# Patient Record
Sex: Female | Born: 1935
Health system: Southern US, Community
[De-identification: ages and names within clinical notes are randomized; demographics above are authoritative.]

## PROBLEM LIST (undated history)

## (undated) DIAGNOSIS — F0280 Dementia in other diseases classified elsewhere without behavioral disturbance: Secondary | ICD-10-CM

## (undated) DIAGNOSIS — M810 Age-related osteoporosis without current pathological fracture: Secondary | ICD-10-CM

## (undated) DIAGNOSIS — C189 Malignant neoplasm of colon, unspecified: Secondary | ICD-10-CM

## (undated) DIAGNOSIS — F329 Major depressive disorder, single episode, unspecified: Secondary | ICD-10-CM

## (undated) DIAGNOSIS — G309 Alzheimer's disease, unspecified: Secondary | ICD-10-CM

## (undated) DIAGNOSIS — K219 Gastro-esophageal reflux disease without esophagitis: Secondary | ICD-10-CM

## (undated) DIAGNOSIS — F32A Depression, unspecified: Secondary | ICD-10-CM

## (undated) DIAGNOSIS — F419 Anxiety disorder, unspecified: Secondary | ICD-10-CM

## (undated) HISTORY — PX: TOTAL HIP ARTHROPLASTY: SHX124

---

## 2008-06-13 ENCOUNTER — Ambulatory Visit
Admission: RE | Admit: 2008-06-13 | Disposition: A | Discharge status: Home / Self Care | Payer: Self-pay | Source: Ambulatory Visit

## 2014-01-09 ENCOUNTER — Inpatient Hospital Stay: Payer: Self-pay | Admitting: Internal Medicine

## 2014-01-09 LAB — TROPONIN I: Troponin-I: <0.02 ng/mL

## 2014-01-09 LAB — COMPREHENSIVE METABOLIC PANEL
ALBUMIN: 3.1 g/dL — AB (ref 3.4–5.0)
ALT: 30 U/L (ref 12–78)
ANION GAP: 6 — AB (ref 7–16)
AST: 25 U/L (ref 15–37)
Alkaline Phosphatase: 126 U/L — ABNORMAL HIGH
BUN: 19 mg/dL — ABNORMAL HIGH (ref 7–18)
Bilirubin,Total: 0.7 mg/dL (ref 0.2–1.0)
Calcium, Total: 8.8 mg/dL (ref 8.5–10.1)
Chloride: 108 mmol/L — ABNORMAL HIGH (ref 98–107)
Co2: 26 mmol/L (ref 21–32)
Creatinine: 0.96 mg/dL (ref 0.60–1.30)
GFR CALC AF AMER: 60 — AB
GFR CALC NON AF AMER: 51 — AB
GLUCOSE: 96 mg/dL (ref 65–99)
Osmolality: 282 (ref 275–301)
Potassium: 4 mmol/L (ref 3.5–5.1)
Sodium: 140 mmol/L (ref 136–145)
Total Protein: 6.7 g/dL (ref 6.4–8.2)

## 2014-01-09 LAB — CBC
HCT: 42.4 % (ref 35.0–47.0)
HGB: 13.9 g/dL (ref 12.0–16.0)
MCH: 31.1 pg (ref 26.0–34.0)
MCHC: 32.8 g/dL (ref 32.0–36.0)
MCV: 95 fL (ref 80–100)
Platelet: 252 10*3/uL (ref 150–440)
RBC: 4.47 10*6/uL (ref 3.80–5.20)
RDW: 14.2 % (ref 11.5–14.5)
WBC: 7.7 10*3/uL (ref 3.6–11.0)

## 2014-01-09 LAB — URINALYSIS, COMPLETE
Bacteria: NONE SEEN
Bilirubin,UR: NEGATIVE
GLUCOSE, UR: NEGATIVE mg/dL (ref 0–75)
NITRITE: NEGATIVE
Ph: 6 (ref 4.5–8.0)
Protein: NEGATIVE
Specific Gravity: 1.015 (ref 1.003–1.030)
Squamous Epithelial: <1
WBC UR: 3 /HPF (ref 0–5)

## 2014-01-10 LAB — BASIC METABOLIC PANEL
ANION GAP: 4 — AB (ref 7–16)
BUN: 21 mg/dL — AB (ref 7–18)
CREATININE: 0.91 mg/dL (ref 0.60–1.30)
Calcium, Total: 8.7 mg/dL (ref 8.5–10.1)
Chloride: 110 mmol/L — ABNORMAL HIGH (ref 98–107)
Co2: 27 mmol/L (ref 21–32)
EGFR (African American): >60
GFR CALC NON AF AMER: 55 — AB
Glucose: 90 mg/dL (ref 65–99)
Osmolality: 284 (ref 275–301)
POTASSIUM: 4.3 mmol/L (ref 3.5–5.1)
Sodium: 141 mmol/L (ref 136–145)

## 2014-01-10 LAB — CBC WITH DIFFERENTIAL/PLATELET
Basophil #: 0.1 10*3/uL (ref 0.0–0.1)
Basophil %: 0.7 %
Eosinophil #: 0.3 10*3/uL (ref 0.0–0.7)
Eosinophil %: 3.6 %
HCT: 40.8 % (ref 35.0–47.0)
HGB: 13.5 g/dL (ref 12.0–16.0)
Lymphocyte #: 1.5 10*3/uL (ref 1.0–3.6)
Lymphocyte %: 17.8 %
MCH: 31.1 pg (ref 26.0–34.0)
MCHC: 33 g/dL (ref 32.0–36.0)
MCV: 95 fL (ref 80–100)
MONO ABS: 0.8 x10 3/mm (ref 0.2–0.9)
Monocyte %: 9.9 %
Neutrophil #: 5.7 10*3/uL (ref 1.4–6.5)
Neutrophil %: 68 %
Platelet: 237 10*3/uL (ref 150–440)
RBC: 4.32 10*6/uL (ref 3.80–5.20)
RDW: 14 % (ref 11.5–14.5)
WBC: 8.4 10*3/uL (ref 3.6–11.0)

## 2014-01-11 LAB — PLATELET COUNT: PLATELETS: 211 10*3/uL (ref 150–440)

## 2014-01-11 LAB — BASIC METABOLIC PANEL
Anion Gap: 6 — ABNORMAL LOW (ref 7–16)
BUN: 20 mg/dL — AB (ref 7–18)
Calcium, Total: 8.2 mg/dL — ABNORMAL LOW (ref 8.5–10.1)
Chloride: 110 mmol/L — ABNORMAL HIGH (ref 98–107)
Co2: 22 mmol/L (ref 21–32)
Creatinine: 0.88 mg/dL (ref 0.60–1.30)
EGFR (African American): >60
GFR CALC NON AF AMER: 57 — AB
GLUCOSE: 113 mg/dL — AB (ref 65–99)
Osmolality: 279 (ref 275–301)
POTASSIUM: 4.2 mmol/L (ref 3.5–5.1)
SODIUM: 138 mmol/L (ref 136–145)

## 2014-01-11 LAB — HEMOGLOBIN: HGB: 10.4 g/dL — AB (ref 12.0–16.0)

## 2014-01-12 LAB — BASIC METABOLIC PANEL
Anion Gap: 9 (ref 7–16)
BUN: 16 mg/dL (ref 7–18)
CALCIUM: 8 mg/dL — AB (ref 8.5–10.1)
CO2: 23 mmol/L (ref 21–32)
Chloride: 110 mmol/L — ABNORMAL HIGH (ref 98–107)
Creatinine: 1.08 mg/dL (ref 0.60–1.30)
EGFR (Non-African Amer.): 45 — ABNORMAL LOW
GFR CALC AF AMER: 52 — AB
Glucose: 96 mg/dL (ref 65–99)
Osmolality: 284 (ref 275–301)
Potassium: 3.6 mmol/L (ref 3.5–5.1)
Sodium: 142 mmol/L (ref 136–145)

## 2014-01-12 LAB — HEMOGLOBIN: HGB: 9 g/dL — ABNORMAL LOW (ref 12.0–16.0)

## 2014-01-13 LAB — CBC WITH DIFFERENTIAL/PLATELET
Basophil #: 0 10*3/uL (ref 0.0–0.1)
Basophil %: 0.3 %
Eosinophil #: 0.1 10*3/uL (ref 0.0–0.7)
Eosinophil %: 0.9 %
HCT: 23.6 % — AB (ref 35.0–47.0)
HGB: 7.7 g/dL — ABNORMAL LOW (ref 12.0–16.0)
LYMPHS ABS: 0.8 10*3/uL — AB (ref 1.0–3.6)
LYMPHS PCT: 9 %
MCH: 30.6 pg (ref 26.0–34.0)
MCHC: 32.4 g/dL (ref 32.0–36.0)
MCV: 95 fL (ref 80–100)
MONOS PCT: 10 %
Monocyte #: 0.8 x10 3/mm (ref 0.2–0.9)
Neutrophil #: 6.7 10*3/uL — ABNORMAL HIGH (ref 1.4–6.5)
Neutrophil %: 79.8 %
Platelet: 168 10*3/uL (ref 150–440)
RBC: 2.5 10*6/uL — ABNORMAL LOW (ref 3.80–5.20)
RDW: 13.9 % (ref 11.5–14.5)
WBC: 8.4 10*3/uL (ref 3.6–11.0)

## 2014-01-14 LAB — URINALYSIS, COMPLETE
BACTERIA: NONE SEEN
Bilirubin,UR: NEGATIVE
Blood: NEGATIVE
Glucose,UR: NEGATIVE mg/dL (ref 0–75)
Ketone: NEGATIVE
Leukocyte Esterase: NEGATIVE
Nitrite: NEGATIVE
Ph: 7 (ref 4.5–8.0)
Protein: NEGATIVE
RBC,UR: <1 /HPF (ref 0–5)
Specific Gravity: 1.012 (ref 1.003–1.030)
Squamous Epithelial: NONE SEEN

## 2014-01-14 LAB — PATHOLOGY REPORT

## 2014-01-14 LAB — HEMOGLOBIN: HGB: 8.8 g/dL — ABNORMAL LOW (ref 12.0–16.0)

## 2014-01-15 LAB — URINE CULTURE

## 2014-02-01 ENCOUNTER — Emergency Department: Payer: Self-pay | Admitting: Emergency Medicine

## 2014-02-01 LAB — COMPREHENSIVE METABOLIC PANEL
ALBUMIN: 2.8 g/dL — AB (ref 3.4–5.0)
ALK PHOS: 223 U/L — AB
AST: 48 U/L — AB (ref 15–37)
Anion Gap: 4 — ABNORMAL LOW (ref 7–16)
BUN: 20 mg/dL — ABNORMAL HIGH (ref 7–18)
Bilirubin,Total: 0.4 mg/dL (ref 0.2–1.0)
CREATININE: 0.85 mg/dL (ref 0.60–1.30)
Calcium, Total: 9.3 mg/dL (ref 8.5–10.1)
Chloride: 108 mmol/L — ABNORMAL HIGH (ref 98–107)
Co2: 32 mmol/L (ref 21–32)
EGFR (African American): >60
GFR CALC NON AF AMER: 59 — AB
Glucose: 126 mg/dL — ABNORMAL HIGH (ref 65–99)
OSMOLALITY: 291 (ref 275–301)
Potassium: 5 mmol/L (ref 3.5–5.1)
SGPT (ALT): 18 U/L (ref 12–78)
Sodium: 144 mmol/L (ref 136–145)
TOTAL PROTEIN: 6.9 g/dL (ref 6.4–8.2)

## 2014-02-01 LAB — CBC
HCT: 37.8 % (ref 35.0–47.0)
HGB: 12.3 g/dL (ref 12.0–16.0)
MCH: 30.8 pg (ref 26.0–34.0)
MCHC: 32.6 g/dL (ref 32.0–36.0)
MCV: 95 fL (ref 80–100)
Platelet: 273 10*3/uL (ref 150–440)
RBC: 3.99 10*6/uL (ref 3.80–5.20)
RDW: 15.3 % — ABNORMAL HIGH (ref 11.5–14.5)
WBC: 7.5 10*3/uL (ref 3.6–11.0)

## 2014-02-01 LAB — LIPASE, BLOOD: Lipase: 108 U/L (ref 73–393)

## 2014-02-01 LAB — TROPONIN I: Troponin-I: <0.02 ng/mL

## 2014-02-02 LAB — URINALYSIS, COMPLETE
Bilirubin,UR: NEGATIVE
Glucose,UR: NEGATIVE mg/dL (ref 0–75)
Ketone: NEGATIVE
Nitrite: POSITIVE
Ph: 6 (ref 4.5–8.0)
Protein: NEGATIVE
RBC,UR: 33 /HPF (ref 0–5)
SPECIFIC GRAVITY: 1.056 (ref 1.003–1.030)
Squamous Epithelial: 2

## 2015-01-18 NOTE — Consult Note (Signed)
Brief Consult Note: Diagnosis: right femoral neck fracture and osteoarthritis.   Patient was seen by consultant.   Consult note dictated.   Recommend to proceed with surgery or procedure.   Orders entered.   Comments: plan right THA tomorrrow.  Electronic Signatures: Laurene Footman (MD)  (Signed 15-Apr-15 18:50)  Authored: Brief Consult Note   Last Updated: 15-Apr-15 18:50 by Laurene Footman (MD)

## 2015-01-18 NOTE — Consult Note (Signed)
PATIENT NAME:  Leslie Snyder, Leslie Snyder MR#:  [id number] DATE OF BIRTH:  1918-10-28  DATE OF CONSULTATION:  01/09/2014  CONSULTING PHYSICIAN:  Laurene Footman, MD  REASON FOR CONSULTATION: Right hip fracture.   HISTORY OF PRESENT ILLNESS: The patient is a 81, 81 year old who suffered a fall a few weeks ago. She initially had a nondisplaced fracture. She came in today with increasing pain and inability to stand, and has x-rays that show a femoral neck fracture where the neck is eroding to the subchondral bone of the head and is unable to bear weight.  Marland Kitchen   PAST MEDICAL HISTORY: Remarkable for significant confusion. She has Alzheimer's that is a significant chronic problem. She has been ambulatory with a walker. Goes to an adult Alzheimer daycare program.   PHYSICAL EXAMINATION: Her right leg has pain with any rotation. It is shortened, but without external rotation. She flexes and extends the toes and has good pulses. No edema. Skin is intact about the hip   X-rays were reviewed. Discussed treatment options with the patient's daughter. Recommendation is for anterior approach hip replacement. The risks, benefits and possible complications were discussed and that this should allow her to be mobile and bear full weight immediately. Risk, in particular, infection, deep vein thrombosis and femoral fracture including with future falls, getting a periprosthetic fracture were all discussed.  She understands this plan and we will plan on surgery tomorrow afternoon.      ____________________________ Laurene Footman, MD mjm:dmm D: 01/09/2014 18:50:41 ET T: 01/09/2014 19:25:40 ET JOB#: [id number]  cc: Laurene Footman, MD, <Dictator> Laurene Footman MD ELECTRONICALLY SIGNED 01/10/2014 7:16

## 2015-01-18 NOTE — Discharge Summary (Signed)
PATIENT NAME:  Leslie Snyder, Leslie Snyder MR#:  [id number] DATE OF BIRTH:  [birth year]  DATE OF ADMISSION:  01/09/2014 DATE OF DISCHARGE:  01/14/2014  PRESENTING COMPLAINT: Fall and right hip pain.   DISCHARGE DIAGNOSES: 1.  Displaced femoral neck fracture status post right total hip replacement by Dr. Rudene Christians, which was done on April 16th.  2.  Dementia.  3.  Dysphagia.   CONDITION ON DISCHARGE: Fair.   CODE STATUS: No code, DNR.   DISCHARGE MEDICATIONS: 1.  Bupropion 75 mg b.i.d.  2.  Omeprazole 40 mg daily.  3.  Alprazolam 0.25 mg p.o. t.i.d. p.r.n.  4.  Escitalopram 5 mg daily.  5.  Docusate 100 mg daily as needed.  6.  Multivitamin with minerals p.o. daily.  7.  Viactiv soft calcium chews calcium with vitamin D p.o. daily.  8.  Namenda XR 21 mg at bedtime.  9.  Acetaminophen 325 mg 2 tablets every 4 hours as needed.  10.  Magnesium hydroxide 30 mL b.i.d. as needed.  11.  Aluminium hydroxide with magnesium hydroxide and simethicone 30 mL every 6 hours as needed. 12.  Enoxaparin 40 mg subcu once a day for 14 days.  13.  Nystatin topical powder 1 application 3 times a day.  14.  Tramadol 50 mg at bedtime.  15.  Tramadol 50 mg every 6 hours as needed.   DIET:  Regular, diet consistency Pureed, nectar thick liquids.   SPEECH THERAPY RECOMMENDATIONS.  1.  Please send Magic Cup with all meal trays. Aspiration precautions. Meds in puree and feeding. assistance.  2.  Dysphagia-I pureed nectar thick. No straws. Please send Mighty shakes t.i.d. on meals.  3.  Speech therapy, physical therapy and dietitian to follow.   DISCHARGE FOLLOWUP: Follow up with [facility] ortho in 2 weeks.  CONSULTANTS: Orthopedics, Dr. Rudene Christians.   DIAGNOSTIC DATA: UA negative for UTI. Hemoglobin is 8.8. White count is 8.4. Basic metabolic panel is within normal limits.   BRIEF SUMMARY OF HOSPITAL COURSE: Leslie Snyder is a 81 year old Caucasian female with history of dementia who comes in after she had a mechanical fall  at home. She was admitted with:  1.  Displaced right femur fracture status post right total hip arthroplasty. It was done by Dr. Rudene Christians on 16th of April. The patient tolerated the procedure well. She is currently on tramadol and Tylenol as needed. Lovenox was given for DVT prophylaxis. The patient is going to be discharged to [facility] for rehab.  2.  Fever postop, resolved. Fever work-up was negative.  3.  Dysphagia prior to admission. Speech evaluation done. She is currently tolerating pureed nectar thick liquid diet.  4.  Dementia. She is getting her Namenda and bupropion, which has been resumed along with p.r.n. Xanax. The patient is already on Lexapro, which has been resumed.   The patient received 1 unit of blood transfusion after surgery since her hemoglobin dropped down to 7.7. Her hemoglobin at discharge is 8.8.   The patient overall had stable hospital course. She will follow up with orthopedics as outpatient. Hospital stay otherwise remained stable. The patient remained a no code, DNR.   TIME SPENT: 40 minutes.   ____________________________ Hart Rochester Posey Pronto, MD sap:sb D: 01/14/2014 13:06:39 ET T: 01/14/2014 13:29:23 ET JOB#: [id number]  cc: [place] Welcome A. Posey Pronto, MD, <Dictator> Ilda Basset MD ELECTRONICALLY SIGNED 01/21/2014 9:09

## 2015-01-18 NOTE — H&P (Signed)
PATIENT NAME:  Leslie Snyder, Leslie Snyder MR#:  [id number] DATE OF BIRTH:  01-24-1919  DATE OF ADMISSION:  01/09/2014  PRIMARY CARE PHYSICIAN: Ricke Hey, MD   CHIEF COMPLAINT: Right hip, chest pain.   HISTORY OF PRESENT ILLNESS: This is a 81 year old Caucasian female patient who normally ambulates with a walker, lives at home with her daughter, who has had recurrent falls secondary to balance issues. Fell 5 days prior, landing on her right side. Since then, she has complained of pleuritic right-sided chest pain and also right hip pain. Had an x-ray with her primary care physician, was found to have a hairline femur fracture, was thought to be needing no surgery as this was not displaced, but the pain worsened and the patient was brought to the Emergency Room. Has an impacted right femur fracture and is being admitted for surgery. Case has been discussed with Dr. Rudene Christians.   The patient has pleuritic chest pain since she fell on the right side onto her chest. No shortness of breath. The patient does have severe dementia, is confused all the time, but is pleasant and recognizes her family at bedside. Not oriented to place or time.   The patient also had some problems with swallowing per family at bedside. Her healthcare power of attorney was a speech therapist in the past.   PAST MEDICAL HISTORY: 1.  GERD.  2.  Depression.  3.  Dementia.  4.  Anxiety.   ALLERGIES: ARICEPT, IBUPROFEN, AND SULFA.  SOCIAL HISTORY: The patient does not smoke. No alcohol. No illicit drugs. Lives with family. Ambulates with a walker.   CODE STATUS: DNR/DNI.   REVIEW OF SYSTEMS: Unobtainable secondary to the patient's severe dementia.   FAMILY HISTORY: Reviewed but unknown.   HOME MEDICATIONS: 1.  Alprazolam 0.25 mg oral 3 times a day as needed.  2.  Bupropion 75 mg 2 times a day.  3.  Docusate sodium 100 mg oral once a day as needed.  4.  Escitalopram 5 mg oral once a day.  5.  Multivitamin 1 tablet daily.  6.  Namenda  XR 21 mg daily.  7.  Omeprazole 40 mg daily.   PHYSICAL EXAMINATION: VITAL SIGNS: Temperature 97.9, pulse 80, respirations 18, blood pressure 175/80, saturating94% on room air.  GENERAL: Elderly, frail Caucasian female patient lying in bed, overall seems comfortable, pleasant, smiling but confused.  HEENT: Atraumatic, normocephalic. Oral mucosa dry and pink. External ears and nose normal. Pallor positive. No icterus. Pupils bilaterally equal and reactive to light.  NECK: Supple. No thyromegaly or palpable lymph node. Trachea midline. No carotid bruits, JVD.  CARDIOVASCULAR: S1, S2, systolic murmur. Peripheral pulses 2+. No edema.  RESPIRATORY: Normal work of breathing. Clear to auscultation on both sides.  GASTROINTESTINAL: Soft abdomen, nontender. Bowel sounds present. No organomegaly palpable.  SKIN: Warm and dry. No petechiae, rash, ulcer.  MUSCULOSKELETAL: Has right hip pain but good range of motion. No other joint swelling, redness noticed.  NEUROLOGICAL: Motor strength 5/5 in upper and lower extremities.   LABORATORY, DIAGNOSTIC, AND RADIOLOGICAL DATA: WBC 7.7, hemoglobin 13.9, platelets of 252.   Urinalysis shows no bacteria, 3 WBCs.   Glucose 96, BUN 19, creatinine 0.96, sodium 140, potassium 4, GFR 51. AST, ALT, alkaline phosphatase, and bilirubin normal.   EKG showed left bundle branch block.   Chest x-ray shows nothing acute.   Right hip x-ray shows impacted subcapital right femoral neck fracture, osteopenia, and pubic symphysis degenerative changes.   ASSESSMENT AND PLAN: 1.  Right  femur fracture in a 81 year old patient with no significant comorbidities. The patient does complain of chest pain, but this is pleuritic and has happened since her fall. Not acute coronary syndrome. She does have left bundle branch block on EKG but does not have any coronary artery disease. I would not further work this up with any noninvasive or invasive work-up. The patient definitely would be  high risk with her age but considering she needs surgery, she can go for surgery keeping the risks in mind, which I have explained to the family at bedside.  2.  Some dysphagia: The patient had some choking sensation with food yesterday per family. Will have speech therapy evaluate patient.  3.  Chronic kidney disease, stage III: Monitor.  4.  Severe dementia: The patient is high risk for inpatient delirium with sundowning. Explained this to family. Will use p.r.n. medications as needed.  5.  Deep vein thrombosis prophylaxis per orthopedics.   CODE STATUS: DNR/DNI.   TIME SPENT TODAY ON THIS CASE: 40 minutes.   ____________________________ Leia Alf Tekeshia Klahr, MD srs:jcm D: 01/09/2014 16:07:05 ET T: 01/09/2014 17:25:18 ET JOB#: [id number]  cc: Alveta Heimlich R. Derricka Mertz, MD, <Dictator> Dr. Ricke Hey Neita Carp MD ELECTRONICALLY SIGNED 01/11/2014 12:52

## 2015-01-18 NOTE — Op Note (Signed)
PATIENT NAME:  Leslie Snyder, Leslie Snyder MR#:  [id number] DATE OF BIRTH:  1919/01/18  DATE OF PROCEDURE:  01/10/2014  PREOPERATIVE DIAGNOSES: Displaced femoral neck fracture; mild osteoarthritis, right hip.   POSTOPERATIVE DIAGNOSES: Displaced femoral neck fracture; mild osteoarthritis, right hip.   PROCEDURE: Right total hip replacement, direct anterior approach.   ANESTHESIA: Spinal.   SURGEON: Laurene Footman, M.D.   DESCRIPTION OF PROCEDURE: The patient was brought to the operating room and after adequate anesthesia was obtained, the patient was placed on the operative table with the left leg on a well-padded table, right foot in the Medacta attachment. C-arm was brought in and good visualization and preop x-ray taken for templating. The hip was then prepped and draped in the usual sterile fashion with appropriate patient identification and timeout procedures completed. Direct anterior approach was made centered over the greater trochanter and tensor fascia lata muscle. The incision was carried down to the tensor fascia lata muscle which was then retracted laterally. Deep fascial belly incised and the lateral femoral circumflex vessels ligated. The anterior capsule was then exposed and an anterior capsulotomy carried out with retractors placed. The femoral neck cut was carried out below the level of the initial fracture. The residual neck was removed, followed by the head which showed mild to moderate osteoarthritis. Labrum was excised, and sequential reaming was carried out to 54 mm. A 54 mm trial was stable and final cup impacted. With the leg externally rotated, ischiofemoral and pubofemoral ligaments released and the femur sequentially broached to a #4. With trials, this gave appropriate leg length. The #4 AMIStem was impacted down the canal with an S28 head and liner for the 54 mm Versafit cup DM assembled. This was impacted onto the stem and the hip reduced. The hip was stable to 90 degrees external  rotation test. The cup appeared stable to pullout test prior to placement and reduction of the femoral components. The hip was thoroughly irrigated. The deep fascia was repaired using a heavy quill suture, 2-0 quill subcutaneously after giving local anesthetic 0.25% Sensorcaine with epinephrine, 30 mL into the periarticular tissue. Xeroform, 4 x 4's and ABD were applied.   ESTIMATED BLOOD LOSS: 650.   SPECIMEN: The femoral head and neck.   There were no complications.   IMPLANTS: Medacta 4 standard stem with a 54 mm Versafit cup DM with liner and an S28 head.   ____________________________ Laurene Footman, MD mjm:gb D: 01/10/2014 21:37:12 ET T: 01/11/2014 03:19:33 ET JOB#: [id number]  cc: Laurene Footman, MD, <Dictator> Laurene Footman MD ELECTRONICALLY SIGNED 01/11/2014 8:19

## 2015-04-14 ENCOUNTER — Emergency Department: Payer: Medicare Other

## 2015-04-14 ENCOUNTER — Emergency Department
Admission: EM | Admit: 2015-04-14 | Discharge: 2015-04-14 | Disposition: A | Discharge status: Home / Self Care | Payer: Medicare Other | Attending: Student | Admitting: Student

## 2015-04-14 ENCOUNTER — Encounter: Payer: Self-pay | Admitting: Emergency Medicine

## 2015-04-14 DIAGNOSIS — G309 Alzheimer's disease, unspecified: Secondary | ICD-10-CM | POA: Diagnosis not present

## 2015-04-14 DIAGNOSIS — M79601 Pain in right arm: Secondary | ICD-10-CM

## 2015-04-14 DIAGNOSIS — F0280 Dementia in other diseases classified elsewhere without behavioral disturbance: Secondary | ICD-10-CM | POA: Insufficient documentation

## 2015-04-14 DIAGNOSIS — I214 Non-ST elevation (NSTEMI) myocardial infarction: Secondary | ICD-10-CM | POA: Diagnosis not present

## 2015-04-14 DIAGNOSIS — M25511 Pain in right shoulder: Secondary | ICD-10-CM | POA: Diagnosis present

## 2015-04-14 HISTORY — DX: Gastro-esophageal reflux disease without esophagitis: K21.9

## 2015-04-14 HISTORY — DX: Alzheimer's disease, unspecified: G30.9

## 2015-04-14 HISTORY — DX: Major depressive disorder, single episode, unspecified: F32.9

## 2015-04-14 HISTORY — DX: Age-related osteoporosis without current pathological fracture: M81.0

## 2015-04-14 HISTORY — DX: Malignant neoplasm of colon, unspecified: C18.9

## 2015-04-14 HISTORY — DX: Dementia in other diseases classified elsewhere, unspecified severity, without behavioral disturbance, psychotic disturbance, mood disturbance, and anxiety: F02.80

## 2015-04-14 HISTORY — DX: Depression, unspecified: F32.A

## 2015-04-14 HISTORY — DX: Anxiety disorder, unspecified: F41.9

## 2015-04-14 LAB — CBC WITH DIFFERENTIAL/PLATELET
Basophils Absolute: 0.1 10*3/uL (ref 0–0.1)
Basophils Relative: 0 %
Eosinophils Absolute: 0 10*3/uL (ref 0–0.7)
Eosinophils Relative: 0 %
HEMATOCRIT: 43 % (ref 35.0–47.0)
HEMOGLOBIN: 14.1 g/dL (ref 12.0–16.0)
LYMPHS ABS: 0.8 10*3/uL — AB (ref 1.0–3.6)
LYMPHS PCT: 6 %
MCH: 30.5 pg (ref 26.0–34.0)
MCHC: 32.9 g/dL (ref 32.0–36.0)
MCV: 92.7 fL (ref 80.0–100.0)
MONOS PCT: 8 %
Monocytes Absolute: 1 10*3/uL — ABNORMAL HIGH (ref 0.2–0.9)
NEUTROS PCT: 86 %
Neutro Abs: 10.6 10*3/uL — ABNORMAL HIGH (ref 1.4–6.5)
Platelets: 184 10*3/uL (ref 150–440)
RBC: 4.64 MIL/uL (ref 3.80–5.20)
RDW: 13.7 % (ref 11.5–14.5)
WBC: 12.5 10*3/uL — AB (ref 3.6–11.0)

## 2015-04-14 LAB — BASIC METABOLIC PANEL
ANION GAP: 9 (ref 5–15)
BUN: 20 mg/dL (ref 6–20)
CO2: 23 mmol/L (ref 22–32)
CREATININE: 0.87 mg/dL (ref 0.44–1.00)
Calcium: 8.9 mg/dL (ref 8.9–10.3)
Chloride: 109 mmol/L (ref 101–111)
GFR, EST NON AFRICAN AMERICAN: 55 mL/min — AB (ref >60)
GLUCOSE: 119 mg/dL — AB (ref 65–99)
Potassium: 3.6 mmol/L (ref 3.5–5.1)
Sodium: 141 mmol/L (ref 135–145)

## 2015-04-14 LAB — LIPASE, BLOOD: LIPASE: 16 U/L — AB (ref 22–51)

## 2015-04-14 LAB — HEPATIC FUNCTION PANEL
ALBUMIN: 3.4 g/dL — AB (ref 3.5–5.0)
ALT: 14 U/L (ref 14–54)
AST: 21 U/L (ref 15–41)
Alkaline Phosphatase: 64 U/L (ref 38–126)
BILIRUBIN INDIRECT: 0.6 mg/dL (ref 0.3–0.9)
Bilirubin, Direct: 0.2 mg/dL (ref 0.1–0.5)
TOTAL PROTEIN: 5.9 g/dL — AB (ref 6.5–8.1)
Total Bilirubin: 0.8 mg/dL (ref 0.3–1.2)

## 2015-04-14 LAB — TROPONIN I
Troponin I: 0.03 ng/mL (ref <0.031)
Troponin I: 0.05 ng/mL — ABNORMAL HIGH (ref <0.031)

## 2015-04-14 MED ORDER — FLUORESCEIN SODIUM 1 MG OP STRP
ORAL_STRIP | OPHTHALMIC | Status: AC
  Filled 2015-04-14: qty 1

## 2015-04-14 MED ORDER — ACETAMINOPHEN 500 MG PO TABS
1000.0000 mg | ORAL_TABLET | Freq: Once | ORAL | Status: AC
  Administered 2015-04-14: 1000 mg via ORAL
  Filled 2015-04-14: qty 2

## 2015-04-14 MED ORDER — TETRACAINE HCL 0.5 % OP SOLN
OPHTHALMIC | Status: AC
  Filled 2015-04-14: qty 2

## 2015-04-14 NOTE — Discharge Instructions (Signed)
Leslie Snyder should return immediately to the emergency department should she complain of any new pain, if she seems to be short of breath (if she is breathing hard and fast, using the muscles of her chest wall to breathe), if she has a dramatic increase in confusion or sudden change in her mental status, fevers, vomiting, diarrhea, blood in vomit or stool, or for any new concerns. Otherwise, she needs to follow-up with Dr. Nehemiah Massed in clinic at 4 PM today.

## 2015-04-14 NOTE — ED Notes (Signed)
Pt to xray

## 2015-04-14 NOTE — ED Notes (Signed)
MD aware of troponin

## 2015-04-14 NOTE — ED Notes (Signed)
Pt presents to ER alert and in NAD. Pt is a DNR. EMS reports they were called out for pt c/o right shoulder pain. Pt is having elevation in Leads 3 & 4. Family at bedside.

## 2015-04-14 NOTE — ED Provider Notes (Signed)
[facility] Emergency Department Provider Note  ____________________________________________  Time seen: Approximately 1:08 AM  I have reviewed the triage vital signs and the nursing notes.   HISTORY  Chief Complaint Shoulder Pain  Caveat-history of present illness and review of systems Limited secondary to the patient's dementia. All history of present illness/review of systems obtained from family at bedside.  HPI Leslie Snyder is a 81 y.o. female with Alzheimer's dementia, anxiety, GERD, arthritis who presents for evaluation of perceived right upper extremity pain. The patient's daughter reports that the patient again crying out in pain this evening, asking her daughter to "take my arms off". No recent fall. Pain seemed to be worse in the right shoulder. The patient has not been ill recently. She has not had any cough, sneezing, runny nose, congestion, vomiting. She has had several bowel movements today but only one has been loose.   Past Medical History  Diagnosis Date  . Alzheimer's disease   . GERD (gastroesophageal reflux disease)   . Depression   . Osteoporosis   . Anxiety   . Colon cancer     There are no active problems to display for this patient.   Past Surgical History  Procedure Laterality Date  . Total hip arthroplasty      No current outpatient prescriptions on file.  Allergies Review of patient's allergies indicates no known allergies.  History reviewed. No pertinent family history.  Social History History  Substance Use Topics  . Smoking status: Never Smoker   . Smokeless tobacco: Not on file  . Alcohol Use: No    Review of Systems  Caveat-history of present illness and review of systems Limited secondary to the patient's dementia. All history of present illness/review of systems obtained from family at bedside. ____________________________________________   PHYSICAL EXAM:  VITAL SIGNS: ED Triage Vitals  Enc Vitals  Group     BP 04/14/15 0013 160/74 mmHg     Pulse Rate 04/14/15 0013 88     Resp 04/14/15 0013 20     Temp 04/14/15 0013 97.7 F (36.5 C)     Temp Source 04/14/15 0013 Oral     SpO2 04/14/15 0013 95 %     Weight --      Height --      Head Cir --      Peak Flow --      Pain Score --      Pain Loc --      Pain Edu? --      Excl. in [place]? --     Constitutional: Alert and oriented to self only. Babbling incoherently which is her baseline. Eyes: Conjunctivae are normal. PERRL. EOMI. Head: Atraumatic. Nose: No congestion/rhinnorhea. Mouth/Throat: Mucous membranes are moist.  Oropharynx non-erythematous. Neck: No stridor.   Cardiovascular: Normal rate, regular rhythm. Grossly normal heart sounds.  Good peripheral circulation. Respiratory: Normal respiratory effort.  No retractions. Lungs CTAB. Gastrointestinal: Soft and nontender. No distention. No abdominal bruits. No CVA tenderness. Genitourinary: deferred Musculoskeletal: The patient screams in pain when I attempt to range the right shoulder and right elbow. She has no pain throughout the left upper extremity or bilateral lower extremity she has full range of motion passively at the hips bilaterally and this does not appear to cause pain. She hasn't no tenderness to palpation throat the C/T/L-spine. Neurologic:  Moves all extremities equally and to command. Skin:  Skin is warm, dry and intact. No rash noted. Psychiatric: Mood and affect are normal  in setting of dementia  ____________________________________________   LABS (all labs ordered are listed, but only abnormal results are displayed)  Labs Reviewed  CBC WITH DIFFERENTIAL/PLATELET - Abnormal; Notable for the following:    WBC 12.5 (*)    Neutro Abs 10.6 (*)    Lymphs Abs 0.8 (*)    Monocytes Absolute 1.0 (*)    All other components within normal limits  BASIC METABOLIC PANEL - Abnormal; Notable for the following:    Glucose, Bld 119 (*)    GFR calc non Af Amer 55 (*)     All other components within normal limits  LIPASE, BLOOD - Abnormal; Notable for the following:    Lipase 16 (*)    All other components within normal limits  HEPATIC FUNCTION PANEL - Abnormal; Notable for the following:    Total Protein 5.9 (*)    Albumin 3.4 (*)    All other components within normal limits  TROPONIN I - Abnormal; Notable for the following:    Troponin I 0.05 (*)    All other components within normal limits  TROPONIN I   ____________________________________________  EKG  ED ECG REPORT I, Joanne Gavel, the attending physician, personally viewed and interpreted this ECG.   Date: 04/14/2015  EKG Time: 00:06  Rate: 89  Rhythm: normal sinus rhythm  Axis: left  Intervals:left bundle branch block and left anterior fascicular block  ST&T Change: No acute ST segment elevation EKG compared to the one obtained on 01/09/2014 and is unchanged. ____________________________________________  RADIOLOGY  CXR FINDINGS: The lungs are well-aerated. Bibasilar airspace opacities may reflect atelectasis or scarring. There is no evidence of pleural effusion or pneumothorax. The cardiomediastinal silhouette is borderline normal in size. No acute osseous abnormalities are seen. IMPRESSION: Bibasilar airspace opacities may reflect atelectasis or scarring.   Xray right shoulder IMPRESSION: 1. No evidence of fracture or dislocation. 2. Chronic superior subluxation of the right humeral head, with underlying subcortical cysts and degenerative change at the glenoid fossa.   Xray right humerus IMPRESSION: No evidence of acute fracture or dislocation. Chronic degenerative change at the right glenohumeral joint.   Xray right elbow FINDINGS: There is no evidence of fracture or dislocation. Scattered calcifications at the medial and lateral epicondyles may reflect degenerative change. A few soft tissue calcifications are seen overlying the olecranon. The visualized joint  spaces are preserved. No significant joint effusion is identified. The soft tissues are unremarkable in appearance. IMPRESSION: No evidence of acute fracture or dislocation.  Xray right forearm IMPRESSION: No evidence of fracture or dislocation.      ____________________________________________   PROCEDURES  Procedure(s) performed: None  Critical Care performed: Total critical care time spent 35 minutes.  ____________________________________________   INITIAL IMPRESSION / ASSESSMENT AND PLAN / ED COURSE  Pertinent labs & imaging results that were available during my care of the patient were reviewed by me and considered in my medical decision making (see chart for details).  Leslie Snyder is a 81 y.o. female with Alzheimer's dementia, anxiety, GERD, arthritis who presents for evaluation of perceived right upper extremity pain. Her exam is generally unremarkable with the exception of severe pain whenever I attempt to range the right shoulder and the right elbow. Plan for screening labs, plain films of the right upper extremity and chest, pain control, reassess for disposition Will treat her pain with tylenol.   ----------------------------------------- 4:57 AM on 04/14/2015 -----------------------------------------  Patient with significant improvement in her pain after Tylenol. She now allows me  to range the right elbow fully. Labs are notable for mild leukocytosis. I discussed with her daughter at bedside that whenever an [facility] patient has an elevated white count, I like to complete the infectious workup with a urinalysis however we would need to catheterize the patient (who is chronically intcontinent) in order to obtain one. The patient's daughter has refused this on the patient's behalf on the grounds that it was likely traumatize the patient and she typically does not suffer from UTIs. Second troponin is mildly elevated at 0.05. I discussed with the patient's daughter at  bedside that mild troponin elevation is concerning for possible heart attack/NSTEMI. The patient's daughter reported to me that the patient is a DO NOT RESUSCITATE/DO NOT INTUBATE, and she would not want any aggressive treatment  (such as catheterization) for any ailments. I have offered admission for cycling of enzymes/risk stratification. Patient's daughter and I discussed risks and benefits of admission. Daughter has declined admission due to the fact that her mother often sundowners and has a difficult time during inpatient hospitalizations because she is in a strange environment. I discussed with the patient's daughter that a heart attack could predispose her mother to a fatal arrhythmia however we discussed hat whether or not the patient was in the hospital when this happened, no CPR/defibrillation would be performed (according to the patient's wishes) and she would be allowed to pass away peacefully. The daughter agrees with this and she does not want admission. She understands risks of taking the patient  home. I discussed the case with Dr. Nehemiah Massed and he will see the patient today at 4:00 for management of NSTEMI as an outpatient. Return precautions discussed with daughter at bedside she is comfortable with discharge plan. Will not give ASA as daughter reports the patient was told never to take anti-inflammatories, though she is not sure why. ____________________________________________   FINAL CLINICAL IMPRESSION(S) / ED DIAGNOSES  Final diagnoses:  NSTEMI (non-ST elevated myocardial infarction)  Arm pain, right      Joanne Gavel, MD 04/14/15 0505

## 2015-04-14 NOTE — ED Notes (Signed)
Pt alert and in NAD at time of d/c to family.

## 2015-04-14 NOTE — ED Notes (Signed)
Pt laying in stretcher in NAD. Resp even and unlabored. Pt awake and looking around room.

## 2016-03-26 ENCOUNTER — Emergency Department: Payer: Medicare Other

## 2016-03-26 ENCOUNTER — Emergency Department
Admission: EM | Admit: 2016-03-26 | Discharge: 2016-03-26 | Disposition: A | Discharge status: Home / Self Care | Payer: Medicare Other | Attending: Emergency Medicine | Admitting: Emergency Medicine

## 2016-03-26 DIAGNOSIS — Y92811 Bus as the place of occurrence of the external cause: Secondary | ICD-10-CM | POA: Diagnosis not present

## 2016-03-26 DIAGNOSIS — S0181XA Laceration without foreign body of other part of head, initial encounter: Secondary | ICD-10-CM | POA: Diagnosis not present

## 2016-03-26 DIAGNOSIS — G309 Alzheimer's disease, unspecified: Secondary | ICD-10-CM | POA: Diagnosis not present

## 2016-03-26 DIAGNOSIS — M810 Age-related osteoporosis without current pathological fracture: Secondary | ICD-10-CM | POA: Insufficient documentation

## 2016-03-26 DIAGNOSIS — S81812A Laceration without foreign body, left lower leg, initial encounter: Secondary | ICD-10-CM | POA: Diagnosis not present

## 2016-03-26 DIAGNOSIS — F329 Major depressive disorder, single episode, unspecified: Secondary | ICD-10-CM | POA: Insufficient documentation

## 2016-03-26 DIAGNOSIS — Y999 Unspecified external cause status: Secondary | ICD-10-CM | POA: Diagnosis not present

## 2016-03-26 DIAGNOSIS — S0081XA Abrasion of other part of head, initial encounter: Secondary | ICD-10-CM

## 2016-03-26 DIAGNOSIS — IMO0002 Reserved for concepts with insufficient information to code with codable children: Secondary | ICD-10-CM

## 2016-03-26 DIAGNOSIS — Y939 Activity, unspecified: Secondary | ICD-10-CM | POA: Insufficient documentation

## 2016-03-26 DIAGNOSIS — W1809XA Striking against other object with subsequent fall, initial encounter: Secondary | ICD-10-CM | POA: Insufficient documentation

## 2016-03-26 DIAGNOSIS — S0990XA Unspecified injury of head, initial encounter: Secondary | ICD-10-CM

## 2016-03-26 DIAGNOSIS — S51812A Laceration without foreign body of left forearm, initial encounter: Secondary | ICD-10-CM | POA: Insufficient documentation

## 2016-03-26 MED ORDER — TETANUS-DIPHTH-ACELL PERTUSSIS 5-2.5-18.5 LF-MCG/0.5 IM SUSP: 0.5000 mL | Freq: Once | INTRAMUSCULAR | Status: DC

## 2016-03-26 NOTE — ED Notes (Signed)
Laceration cart and dermabond placed in pt room  She is in CT scan
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
## 2016-03-26 NOTE — Discharge Instructions (Signed)
Abrasion An abrasion is a cut or scrape on the outer surface of your skin. An abrasion does not extend through all of the layers of your skin. It is important to care for your abrasion properly to prevent infection. CAUSES Most abrasions are caused by falling on or gliding across the ground or another surface. When your skin rubs on something, the outer and inner layer of skin rubs off.  SYMPTOMS A cut or scrape is the main symptom of this condition. The scrape may be bleeding, or it may appear red or pink. If there was an associated fall, there may be an underlying bruise. DIAGNOSIS An abrasion is diagnosed with a physical exam. TREATMENT Treatment for this condition depends on how large and deep the abrasion is. Usually, your abrasion will be cleaned with water and mild soap. This removes any dirt or debris that may be stuck. An antibiotic ointment may be applied to the abrasion to help prevent infection. A bandage (dressing) may be placed on the abrasion to keep it clean. You may also need a tetanus shot. HOME CARE INSTRUCTIONS Medicines  Take or apply medicines only as directed by your health care provider.  If you were prescribed an antibiotic ointment, finish all of it even if you start to feel better. Wound Care  Clean the wound with mild soap and water 2-3 times per day or as directed by your health care provider. Pat your wound dry with a clean towel. Do not rub it.  There are many different ways to close and cover a wound. Follow instructions from your health care provider about:  Wound care.  Dressing changes and removal.  Check your wound every day for signs of infection. Watch for:  Redness, swelling, or pain.  Fluid, blood, or pus. General Instructions  Keep the dressing dry as directed by your health care provider. Do not take baths, swim, use a hot tub, or do anything that would put your wound underwater until your health care provider approves.  If there is  swelling, raise (elevate) the injured area above the level of your heart while you are sitting or lying down.  Keep all follow-up visits as directed by your health care provider. This is important. SEEK MEDICAL CARE IF:  You received a tetanus shot and you have swelling, severe pain, redness, or bleeding at the injection site.  Your pain is not controlled with medicine.  You have increased redness, swelling, or pain at the site of your wound. SEEK IMMEDIATE MEDICAL CARE IF:  You have a red streak going away from your wound.  You have a fever.  You have fluid, blood, or pus coming from your wound.  You notice a bad smell coming from your wound or your dressing.   This information is not intended to replace advice given to you by your health care provider. Make sure you discuss any questions you have with your health care provider.   Document Released: 06/23/2005 Document Revised: 06/04/2015 Document Reviewed: 09/11/2014 Elsevier Interactive Patient Education 2016 [employer]  

## 2016-03-26 NOTE — ED Notes (Signed)
MD at bedside. 

## 2016-03-26 NOTE — ED Notes (Signed)
Pt to CT scan.

## 2016-03-26 NOTE — ED Notes (Signed)
Patient transported to CT 
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
## 2018-04-27 DEATH — deceased
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
## 2020-02-20 ENCOUNTER — Telehealth (RURAL_HEALTH_CENTER): Payer: Self-pay

## 2020-02-20 NOTE — Telephone Encounter (Signed)
error
